# Patient Record
Sex: Female | Born: 1950 | Race: Black or African American | Hispanic: No | Marital: Single
Health system: Southern US, Community
[De-identification: ages and names within clinical notes are randomized; demographics above are authoritative.]

---

## 2019-04-17 ENCOUNTER — Other Ambulatory Visit: Payer: Self-pay | Admitting: Family Medicine

## 2019-04-17 DIAGNOSIS — R011 Cardiac murmur, unspecified: Secondary | ICD-10-CM

## 2019-05-02 ENCOUNTER — Ambulatory Visit: Admission: RE | Admit: 2019-05-02 | Payer: Medicare Other | Source: Ambulatory Visit

## 2019-06-26 ENCOUNTER — Other Ambulatory Visit: Payer: Self-pay | Admitting: Family Medicine

## 2019-07-11 ENCOUNTER — Other Ambulatory Visit: Payer: Self-pay

## 2019-07-11 ENCOUNTER — Ambulatory Visit
Admission: RE | Admit: 2019-07-11 | Discharge: 2019-07-11 | Disposition: A | Discharge status: Home / Self Care | Payer: Medicare Other | Source: Ambulatory Visit | Attending: Family Medicine | Admitting: Family Medicine

## 2019-07-11 DIAGNOSIS — R011 Cardiac murmur, unspecified: Secondary | ICD-10-CM | POA: Diagnosis present

## 2019-07-11 DIAGNOSIS — I10 Essential (primary) hypertension: Secondary | ICD-10-CM | POA: Diagnosis not present

## 2019-07-11 NOTE — Progress Notes (Signed)
*  PRELIMINARY RESULTS* Echocardiogram 2D Echocardiogram has been performed.  Erin Andrews 07/11/2019, 11:58 AM

## 2019-08-07 ENCOUNTER — Other Ambulatory Visit: Payer: Self-pay | Admitting: Family Medicine

## 2019-08-07 DIAGNOSIS — Z13820 Encounter for screening for osteoporosis: Secondary | ICD-10-CM

## 2020-05-06 ENCOUNTER — Other Ambulatory Visit: Payer: Self-pay | Admitting: Student

## 2020-05-06 DIAGNOSIS — R634 Abnormal weight loss: Secondary | ICD-10-CM

## 2020-05-14 ENCOUNTER — Ambulatory Visit
Admission: RE | Admit: 2020-05-14 | Discharge: 2020-05-14 | Disposition: A | Discharge status: Home / Self Care | Payer: Medicare Other | Source: Ambulatory Visit | Attending: Student | Admitting: Student

## 2020-05-14 ENCOUNTER — Other Ambulatory Visit: Payer: Self-pay

## 2020-05-21 ENCOUNTER — Other Ambulatory Visit (HOSPITAL_COMMUNITY): Payer: Self-pay | Admitting: Student

## 2020-05-21 ENCOUNTER — Other Ambulatory Visit: Payer: Self-pay | Admitting: Student

## 2020-05-21 DIAGNOSIS — R160 Hepatomegaly, not elsewhere classified: Secondary | ICD-10-CM

## 2020-05-30 ENCOUNTER — Encounter: Payer: Self-pay | Admitting: Student

## 2020-05-30 ENCOUNTER — Encounter: Payer: Self-pay | Admitting: *Deleted

## 2020-06-04 ENCOUNTER — Ambulatory Visit: Payer: Medicare Other

## 2021-12-27 IMAGING — US US ABDOMEN COMPLETE
1 series · 13 of 25 positions shown · non-contrast
Comparison: None.

CLINICAL DATA: Hyperbilirubinemia.  Alcohol use.

EXAM:
ABDOMEN ULTRASOUND COMPLETE

[Series 1: us abdomen complete · 13 of 96 slices shown]
[im 1/96]
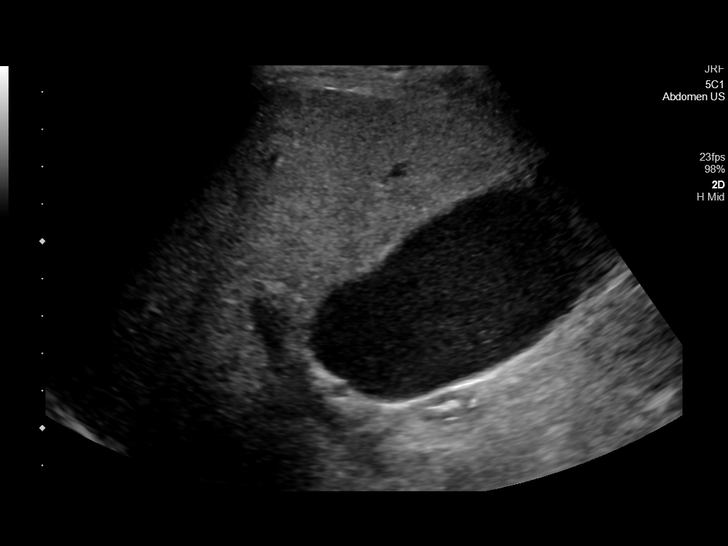
[im 8/96]
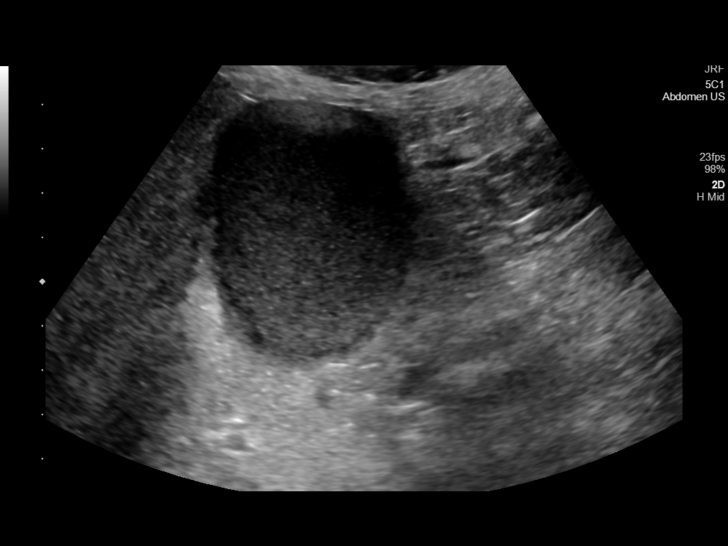
[im 16/96]
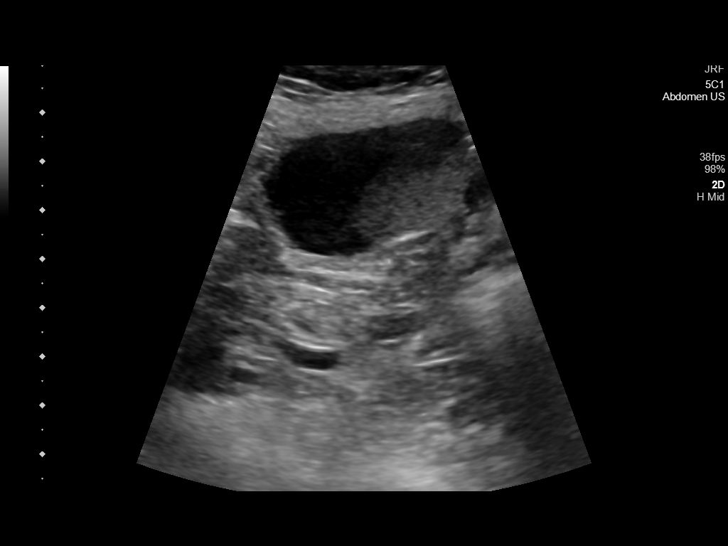
[im 24/96]
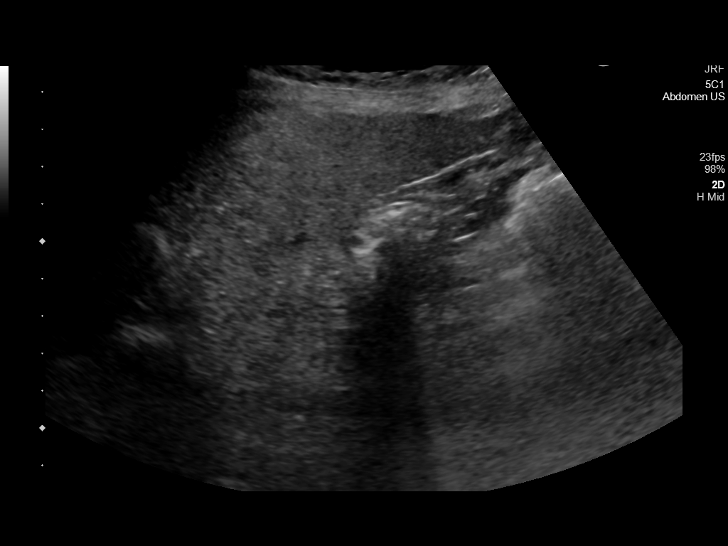
[im 32/96]
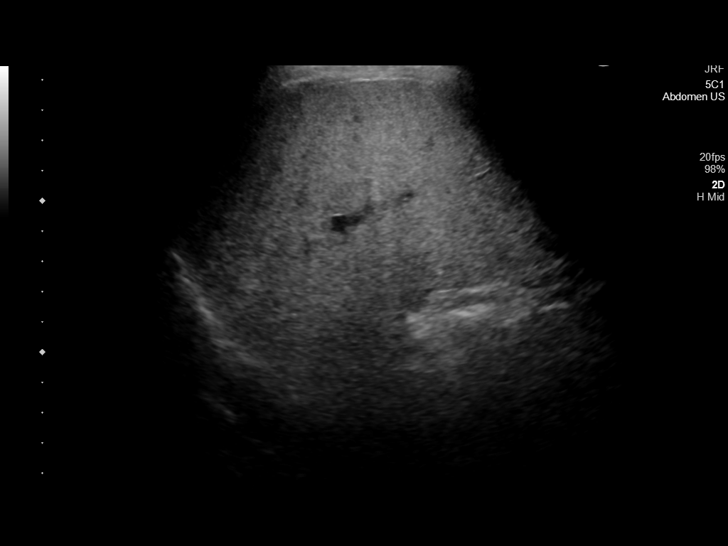
[im 40/96]
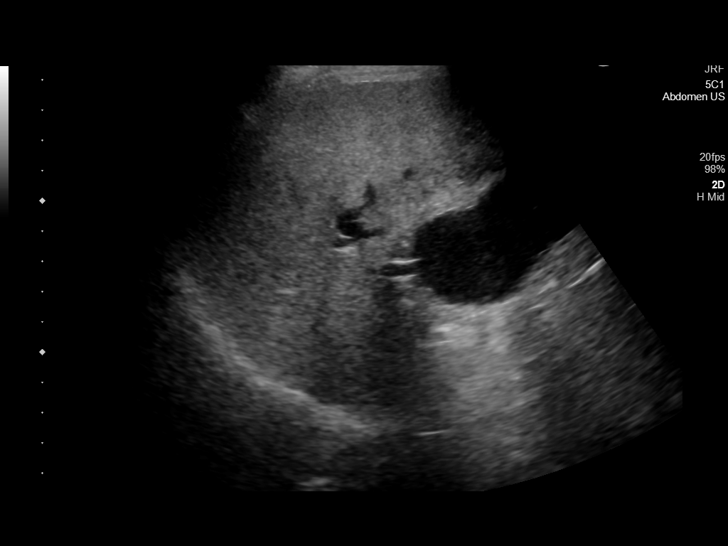
[im 48/96]
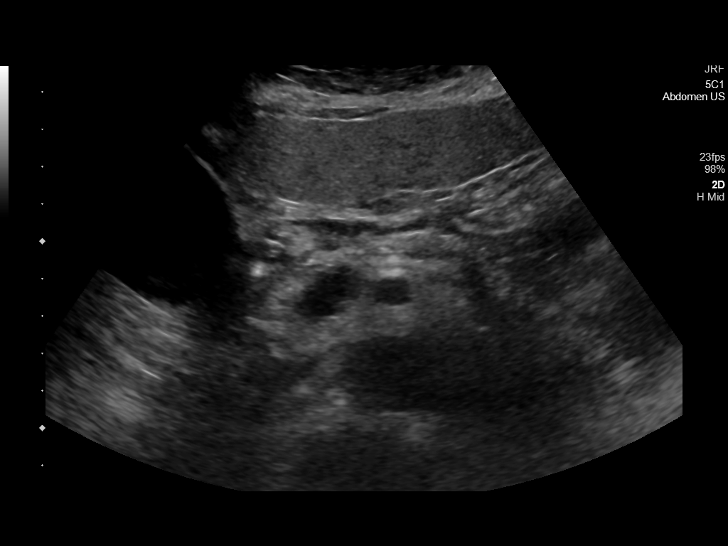
[im 56/96]
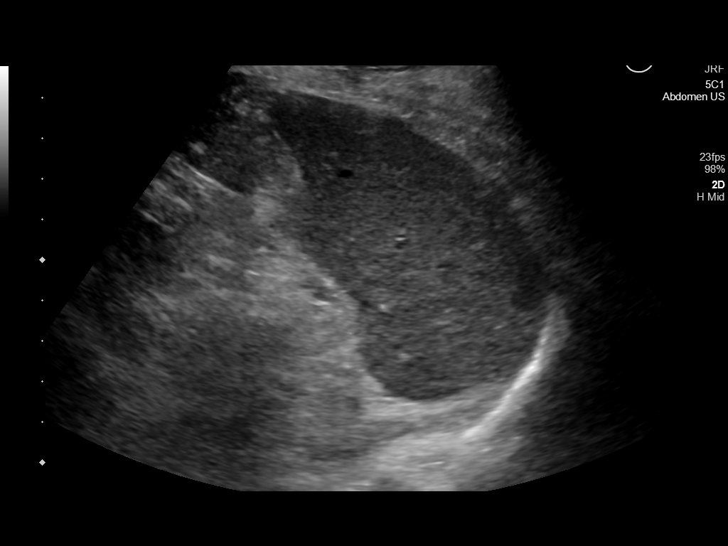
[im 64/96]
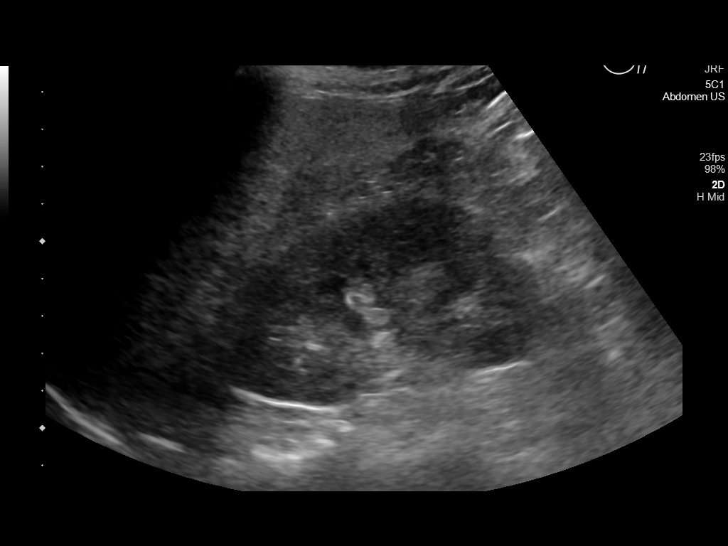
[im 72/96]
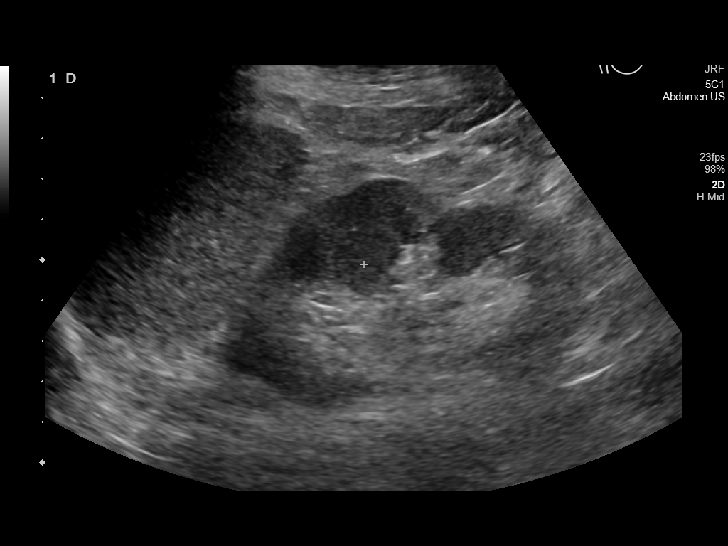
[im 80/96]
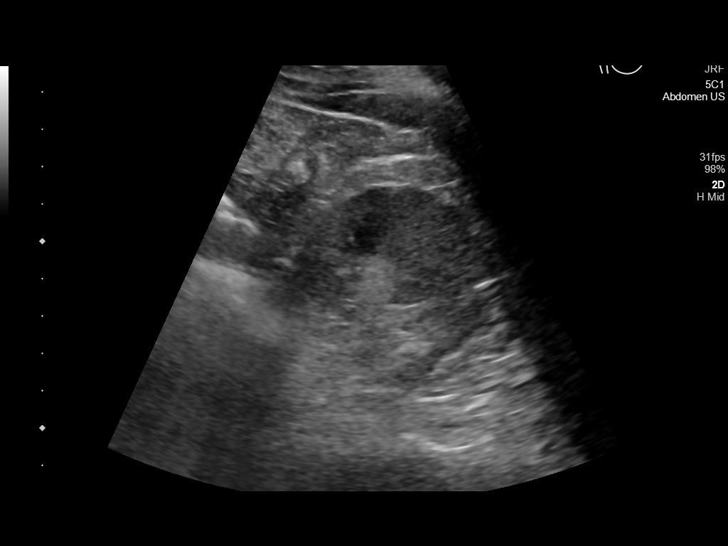
[im 88/96]
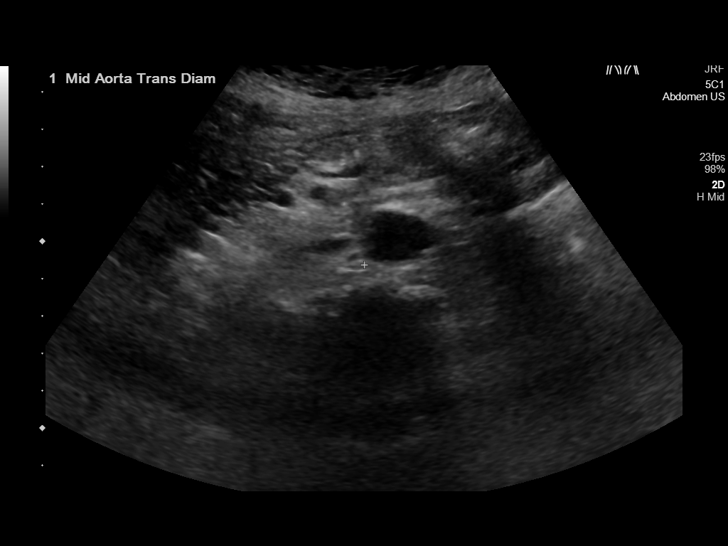
[im 96/96]
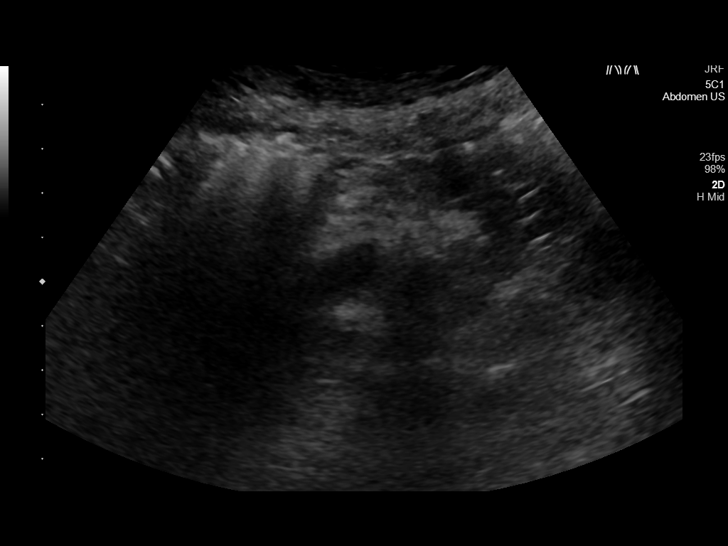

[13 of 25 positions shown; findings below may reference images not displayed]

FINDINGS: Gallbladder: Sludge noted within the gallbladder lumen. Nonmobile,
avascular, sludge ball noted within the gallbladder lumen fundus. No
gallstones or wall thickening visualized. No sonographic Murphy sign
noted by sonographer.

Common bile duct: Diameter: 3 mm

Liver: No focal lesion identified. Nodular hepatic contour.
Coarsened and increased parenchymal echogenicity. Portal vein is
patent on color Doppler imaging with normal direction of blood flow
towards the liver.

IVC: No abnormality visualized.

Pancreas: Visualized portion unremarkable.

Spleen: Size and appearance within normal limits.

Right Kidney: Length: 9.4 cm. Echogenicity within normal limits. No
mass or hydronephrosis visualized.

Left Kidney: Length: 9.2 cm. Echogenicity within normal limits. No
mass or hydronephrosis visualized.

Abdominal aorta: No aneurysm visualized.  Atherosclerotic plaque.

Other findings: None.
IMPRESSION: 1. Cirrhosis and hepatic steatosis. Please note limited evaluation
for focal hepatic masses in a patient with hepatic steatosis due to
decreased penetration of the acoustic ultrasound waves. Recommend
MRI liver protocol for further evaluation for underlying focal
hepatic mass.
2. Gallbladder sludge.

These results will be called to the ordering clinician or
representative by the Radiologist Assistant, and communication
documented in the PACS or [REDACTED].

## 2024-04-17 ENCOUNTER — Other Ambulatory Visit: Payer: Self-pay | Admitting: Family Medicine

## 2024-04-17 DIAGNOSIS — K7030 Alcoholic cirrhosis of liver without ascites: Secondary | ICD-10-CM

## 2024-05-08 ENCOUNTER — Ambulatory Visit
Admission: RE | Admit: 2024-05-08 | Discharge: 2024-05-08 | Disposition: A | Source: Ambulatory Visit | Attending: Family Medicine | Admitting: Family Medicine

## 2024-05-08 DIAGNOSIS — K7030 Alcoholic cirrhosis of liver without ascites: Secondary | ICD-10-CM | POA: Insufficient documentation
